# Patient Record
Sex: Male | Born: 1970 | Smoking: Current every day smoker
Health system: Southern US, Community
[De-identification: ages and names within clinical notes are randomized; demographics above are authoritative.]

## PROBLEM LIST (undated history)

## (undated) DIAGNOSIS — R079 Chest pain, unspecified: Secondary | ICD-10-CM

## (undated) DIAGNOSIS — I2583 Coronary atherosclerosis due to lipid rich plaque: Secondary | ICD-10-CM

## (undated) DIAGNOSIS — I10 Essential (primary) hypertension: Secondary | ICD-10-CM

## (undated) HISTORY — DX: Coronary atherosclerosis due to lipid rich plaque: I25.83

## (undated) HISTORY — PX: TONSILLECTOMY: SUR1361

## (undated) HISTORY — DX: Chest pain, unspecified: R07.9

## (undated) HISTORY — DX: Essential (primary) hypertension: I10

---

## 2018-07-13 ENCOUNTER — Emergency Department (HOSPITAL_COMMUNITY): Payer: 59

## 2018-07-13 ENCOUNTER — Emergency Department (HOSPITAL_COMMUNITY)
Admission: EM | Admit: 2018-07-13 | Discharge: 2018-07-13 | Discharge status: Against Medical Advice | Payer: 59 | Attending: Emergency Medicine | Admitting: Emergency Medicine

## 2018-07-13 ENCOUNTER — Encounter (HOSPITAL_COMMUNITY): Payer: Self-pay

## 2018-07-13 ENCOUNTER — Other Ambulatory Visit: Payer: Self-pay

## 2018-07-13 DIAGNOSIS — R51 Headache: Secondary | ICD-10-CM | POA: Diagnosis not present

## 2018-07-13 DIAGNOSIS — F17210 Nicotine dependence, cigarettes, uncomplicated: Secondary | ICD-10-CM | POA: Insufficient documentation

## 2018-07-13 DIAGNOSIS — R519 Headache, unspecified: Secondary | ICD-10-CM

## 2018-07-13 DIAGNOSIS — R5383 Other fatigue: Secondary | ICD-10-CM | POA: Diagnosis not present

## 2018-07-13 DIAGNOSIS — R531 Weakness: Secondary | ICD-10-CM | POA: Diagnosis not present

## 2018-07-13 DIAGNOSIS — Z5320 Procedure and treatment not carried out because of patient's decision for unspecified reasons: Secondary | ICD-10-CM | POA: Insufficient documentation

## 2018-07-13 LAB — COMPREHENSIVE METABOLIC PANEL
ALT: 18 U/L (ref 0–44)
AST: 19 U/L (ref 15–41)
Albumin: 4 g/dL (ref 3.5–5.0)
Alkaline Phosphatase: 109 U/L (ref 38–126)
Anion gap: 10 (ref 5–15)
BUN: 12 mg/dL (ref 6–20)
CO2: 24 mmol/L (ref 22–32)
Calcium: 9.6 mg/dL (ref 8.9–10.3)
Chloride: 107 mmol/L (ref 98–111)
Creatinine, Ser: 1.07 mg/dL (ref 0.61–1.24)
GFR calc Af Amer: >60 mL/min (ref >60)
GFR calc non Af Amer: >60 mL/min (ref >60)
Glucose, Bld: 107 mg/dL — ABNORMAL HIGH (ref 70–99)
Potassium: 4 mmol/L (ref 3.5–5.1)
Sodium: 141 mmol/L (ref 135–145)
Total Bilirubin: 0.5 mg/dL (ref 0.3–1.2)
Total Protein: 6.8 g/dL (ref 6.5–8.1)

## 2018-07-13 LAB — CBC
HCT: 43.3 % (ref 39.0–52.0)
Hemoglobin: 14.4 g/dL (ref 13.0–17.0)
MCH: 30.3 pg (ref 26.0–34.0)
MCHC: 33.3 g/dL (ref 30.0–36.0)
MCV: 91 fL (ref 80.0–100.0)
Platelets: 274 10*3/uL (ref 150–400)
RBC: 4.76 MIL/uL (ref 4.22–5.81)
RDW: 12.9 % (ref 11.5–15.5)
WBC: 12 10*3/uL — ABNORMAL HIGH (ref 4.0–10.5)
nRBC: 0 % (ref 0.0–0.2)

## 2018-07-13 LAB — DIFFERENTIAL
Abs Immature Granulocytes: 0.04 10*3/uL (ref 0.00–0.07)
Basophils Absolute: 0.1 10*3/uL (ref 0.0–0.1)
Basophils Relative: 1 %
Eosinophils Absolute: 0.3 10*3/uL (ref 0.0–0.5)
Eosinophils Relative: 2 %
Immature Granulocytes: 0 %
Lymphocytes Relative: 22 %
Lymphs Abs: 2.6 10*3/uL (ref 0.7–4.0)
Monocytes Absolute: 0.7 10*3/uL (ref 0.1–1.0)
Monocytes Relative: 6 %
Neutro Abs: 8.3 10*3/uL — ABNORMAL HIGH (ref 1.7–7.7)
Neutrophils Relative %: 69 %

## 2018-07-13 LAB — I-STAT CHEM 8, ED
BUN: 14 mg/dL (ref 6–20)
Calcium, Ion: 1.21 mmol/L (ref 1.15–1.40)
Chloride: 108 mmol/L (ref 98–111)
Creatinine, Ser: 1 mg/dL (ref 0.61–1.24)
Glucose, Bld: 100 mg/dL — ABNORMAL HIGH (ref 70–99)
HCT: 42 % (ref 39.0–52.0)
Hemoglobin: 14.3 g/dL (ref 13.0–17.0)
Potassium: 4 mmol/L (ref 3.5–5.1)
Sodium: 141 mmol/L (ref 135–145)
TCO2: 26 mmol/L (ref 22–32)

## 2018-07-13 LAB — APTT: aPTT: 25 seconds (ref 24–36)

## 2018-07-13 LAB — PROTIME-INR
INR: 1 (ref 0.8–1.2)
Prothrombin Time: 12.9 seconds (ref 11.4–15.2)

## 2018-07-13 LAB — CBG MONITORING, ED: Glucose-Capillary: 93 mg/dL (ref 70–99)

## 2018-07-13 MED ORDER — DEXAMETHASONE SODIUM PHOSPHATE 10 MG/ML IJ SOLN
10.0000 mg | Freq: Once | INTRAMUSCULAR | Status: AC
  Administered 2018-07-13: 22:00:00 10 mg via INTRAVENOUS
  Filled 2018-07-13: qty 1

## 2018-07-13 MED ORDER — SODIUM CHLORIDE 0.9% FLUSH
3.0000 mL | Freq: Once | INTRAVENOUS | Status: AC
  Administered 2018-07-13: 3 mL via INTRAVENOUS

## 2018-07-13 MED ORDER — DIPHENHYDRAMINE HCL 50 MG/ML IJ SOLN
25.0000 mg | Freq: Once | INTRAMUSCULAR | Status: AC
  Administered 2018-07-13: 22:00:00 25 mg via INTRAVENOUS
  Filled 2018-07-13: qty 1

## 2018-07-13 MED ORDER — SODIUM CHLORIDE 0.9 % IV BOLUS
1000.0000 mL | Freq: Once | INTRAVENOUS | Status: AC
  Administered 2018-07-13: 22:00:00 1000 mL via INTRAVENOUS

## 2018-07-13 MED ORDER — PROCHLORPERAZINE EDISYLATE 10 MG/2ML IJ SOLN
10.0000 mg | Freq: Once | INTRAMUSCULAR | Status: AC
  Administered 2018-07-13: 22:00:00 10 mg via INTRAVENOUS
  Filled 2018-07-13: qty 2

## 2018-07-13 NOTE — ED Notes (Signed)
Pt ambulated to the bathroom w/ no difficulties.   

## 2018-07-13 NOTE — ED Notes (Signed)
ED Provider at bedside. 

## 2018-07-13 NOTE — Discharge Instructions (Signed)
Please return the emergency department tomorrow so he can complete your work-up.  Should you experience any sudden worsening of symptoms, weakness, speech changes or vision disturbance, please call 911 to be transferred to the emergency department immediately.  Thank you for allowing [place] to participate in your care today.

## 2018-07-13 NOTE — ED Notes (Signed)
Pt notified RN that he had to go now b/c his wife was in the car and she has cancer and had to go home.  PA notified, bedside.  Plan: pt will leave AMA and comeback tomorrow for MRI tomorrow.  He understands he will need to check back in.  Pt calm and cooperate.

## 2018-07-13 NOTE — ED Triage Notes (Signed)
LSN Sunday, pt woke up with R sided facial droop, R sided numbness and weakness in his face and R arm. No drift. Also having headaches since Sunday and some confusion, pt states he spoke with PCP yesterday who told him to come and he didn't comprehend. Cough for a few days, had covid test yesterday that was negative.

## 2018-07-13 NOTE — ED Provider Notes (Signed)
[facility] EMERGENCY DEPARTMENT Provider Note   CSN: [id number] Arrival date & time: 07/13/18  1907    History   Chief Complaint Chief Complaint  Patient presents with  . Stroke Symptoms    HPI Joshua Pollard is a 48 y.o. male.     HPI   Patient is a 48 year old male with a past medical history of migraines and hypoglycemic episodes presenting for right-sided facial, arm, and leg weakness.  Last known well was 07/12/18 just before noon.  Patient reports that when he went to bed on 07-11-2018 he felt "not right" and thought he was "coming down with something".  He went to see his primary care provider who screened him for COVID and that test was negative.  His son reported that he noted that his father's right face was drooping and the patient felt that his right upper and right lower extremity were weak.  He reports that he developed a headache at some point yesterday but he is not sure when it began he reports that his current headache is worse than normal.  He describes it as sharp in the right retro-orbital region.  He denies visual disturbance.  He reports that his right lower extremity has decreased sensation but otherwise has no numbness.  Patient denies any lower extremity weakness currently.  Patient denies any personal history of CVA.  Denies history of cardiovascular or pulmonary disease.  No family history of early CVA.  He does have a grandfather who had a brain aneurysm.  Patient smokes approximately 2 packs of cigarettes per day.  History reviewed. No pertinent past medical history.  There are no active problems to display for this patient.   Past Surgical History:  Procedure Laterality Date  . TONSILLECTOMY          Home Medications    Prior to Admission medications   Not on File    Family History No family history on file.  Social History Social History   Tobacco Use  . Smoking status: Not on file  Substance Use Topics  . Alcohol use:  Not on file  . Drug use: Not on file     Allergies   Patient has no known allergies.   Review of Systems Review of Systems  Constitutional: Positive for fatigue. Negative for chills and fever.  HENT: Negative for congestion, rhinorrhea, sinus pain and sore throat.   Eyes: Negative for visual disturbance.  Respiratory: Negative for cough, chest tightness and shortness of breath.   Cardiovascular: Negative for chest pain, palpitations and leg swelling.  Gastrointestinal: Negative for abdominal pain, diarrhea, nausea and vomiting.  Genitourinary: Negative for dysuria and flank pain.  Musculoskeletal: Negative for back pain and myalgias.  Skin: Negative for rash.  Neurological: Positive for weakness and headaches. Negative for dizziness, seizures, syncope, speech difficulty and light-headedness.     Physical Exam Updated Vital Signs BP (!) 141/93   Pulse 77   Temp 98 F (36.7 C) (Oral)   Resp (!) 21   Ht 6\' 2"  (1.88 m)   Wt 85.3 kg   SpO2 96%   BMI 24.14 kg/m   Physical Exam Vitals signs and nursing note reviewed.  Constitutional:      General: He is not in acute distress.    Appearance: He is well-developed. He is not ill-appearing or diaphoretic.  HENT:     Head: Normocephalic and atraumatic.     Mouth/Throat:     Mouth: Mucous membranes are moist.  Eyes:  Conjunctiva/sclera: Conjunctivae normal.     Pupils: Pupils are equal, round, and reactive to light.  Neck:     Musculoskeletal: Normal range of motion and neck supple.  Cardiovascular:     Rate and Rhythm: Normal rate and regular rhythm.     Heart sounds: S1 normal and S2 normal. No murmur.  Pulmonary:     Effort: Pulmonary effort is normal.     Breath sounds: Normal breath sounds. No wheezing or rales.  Abdominal:     General: There is no distension.     Palpations: Abdomen is soft.     Tenderness: There is no abdominal tenderness. There is no guarding.  Musculoskeletal: Normal range of motion.         General: No deformity.  Lymphadenopathy:     Cervical: No cervical adenopathy.  Skin:    General: Skin is warm and dry.     Findings: No erythema or rash.  Neurological:     Mental Status: He is alert.     Comments: Mental Status:  Alert, oriented, thought content appropriate, able to give a coherent history. Speech fluent without evidence of aphasia. Able to follow 2 step commands without difficulty.  Cranial Nerves:  II:  Peripheral visual fields grossly normal, pupils equal, round, reactive to light III,IV, VI: ptosis not present, extra-ocular motions intact bilaterally  V,VII: Right lip is downturning. Patient preserves muscles of forehead. Facial light touch sensation equal VIII: hearing grossly normal to voice  X: uvula elevates symmetrically  XI: Decreased strength with shrug of right shoulder. Normal strength with shrug of left shoulder. XII: midline tongue extension without fassiculations Motor:  Normal tone. 5/5 in right lower extremities bilaterally including strong dorsiflexion/plantar flexion. Strength 4+/5 in right upper extremity with grip strength, flexion, extension, abduction and adduction.  Strength 5 out of 5 in left upper extremity throughout. Sensory: Pinprick and light touch normal in all extremities.  Deep Tendon Reflexes: 2+ and symmetric in the biceps and patella.  Cerebellar: normal finger-to-nose with bilateral upper extremities Gait: normal gait and balance Stance: No pronator drift and good coordination, strength, and position sense with tapping of bilateral arms (performed in sitting position). CV: distal pulses palpable throughout    Psychiatric:        Behavior: Behavior normal.        Thought Content: Thought content normal.        Judgment: Judgment normal.      ED Treatments / Results  Labs (all labs ordered are listed, but only abnormal results are displayed) Labs Reviewed  CBC - Abnormal; Notable for the following components:      Result  Value   WBC 12.0 (*)    All other components within normal limits  DIFFERENTIAL - Abnormal; Notable for the following components:   Neutro Abs 8.3 (*)    All other components within normal limits  COMPREHENSIVE METABOLIC PANEL - Abnormal; Notable for the following components:   Glucose, Bld 107 (*)    All other components within normal limits  I-STAT CHEM 8, ED - Abnormal; Notable for the following components:   Glucose, Bld 100 (*)    All other components within normal limits  PROTIME-INR  APTT  CBG MONITORING, ED    EKG EKG Interpretation  Date/Time:  Tuesday Jul 13 2018 19:28:56 EDT Ventricular Rate:  86 PR Interval:  138 QRS Duration: 80 QT Interval:  362 QTC Calculation: 433 R Axis:   25 Text Interpretation:  Normal sinus rhythm Normal ECG  no prior ECG for comparison.  No STEMI Confirmed by Theda Belfast ([id number]) on 07/13/2018 8:43:00 PM   Radiology Ct Head Wo Contrast  Result Date: 07/13/2018 CLINICAL DATA:  Right side facial numbness and weakness. Headaches. Symptoms since 07/11/2018. EXAM: CT HEAD WITHOUT CONTRAST TECHNIQUE: Contiguous axial images were obtained from the base of the skull through the vertex without intravenous contrast. COMPARISON:  None. FINDINGS: Brain: No evidence of acute infarction, hemorrhage, hydrocephalus, extra-axial collection or mass lesion/mass effect. Vascular: No hyperdense vessel or unexpected calcification. Skull: Normal. Negative for fracture or focal lesion. Sinuses/Orbits: Negative. Other: None. IMPRESSION: Negative exam. Electronically Signed   By: Drusilla Kanner M.D.   On: 07/13/2018 20:10    Procedures Procedures (including critical care time)  Medications Ordered in ED Medications  sodium chloride flush (NS) 0.9 % injection 3 mL (3 mLs Intravenous Given 07/13/18 2131)  sodium chloride 0.9 % bolus 1,000 mL (1,000 mLs Intravenous New Bag/Given 07/13/18 2135)  prochlorperazine (COMPAZINE) injection 10 mg (10 mg Intravenous Given  07/13/18 2138)  diphenhydrAMINE (BENADRYL) injection 25 mg (25 mg Intravenous Given 07/13/18 2138)  dexamethasone (DECADRON) injection 10 mg (10 mg Intravenous Given 07/13/18 2138)     Initial Impression / Assessment and Plan / ED Course  I have reviewed the triage vital signs and the nursing notes.  Pertinent labs & imaging results that were available during my care of the patient were reviewed by me and considered in my medical decision making (see chart for details).  Clinical Course as of Jul 13 45  Tue Jul 13, 2018  2046 WBC(!): 12.0 [AM]  2124 Spoke with Dr. Laurence Slate who recommends migraine cocktail and MRI brain without contrast. If MRI negative and HA still not improving, would recommend valproic acid load. Appreciate his involvement.    [AM]  2125 Patient reports he cannot stay any longer.  He reports that his wife has cancer and she is waiting in the car and severe pain in his vomiting.  Patient reports that he understands that having a CVA could potentially be disabling if not found.  Patient reports symptoms are improving.  Objectively, facial drooping is improving, and his right upper extremity appears more functional.  He is aware of the risks of leaving.  He reports that he will return tomorrow to obtain his MRI. I instructed patient not to drive until symptoms are worked up. He reports his wife will drive home tonight.    [AM]    Clinical Course User Index [AM] Elisha Ponder, PA-C       This is a 49 year old male.  EKG with no significant cerebrovascular risk factors presenting for right-sided facial and arm weakness.  Patient is well over 24 hours since last known well.  He is also concomitantly having a migraine, and differential includes CVA, complicated migraine, intracranial tumor.   Work-up showing mild leukocytosis of 12.0.  No significant laboratory abnormalities.  EKG in normal sinus rhythm with no evidence of acute ischemia, infarction, or arrhythmia.  CT  noncontrast of head demonstrates no acute abnormalities.  Per discussion with Dr. Laurence Slate of neurology, will obtain MRI brain without contrast and if normal, will continue to treat for migraine.  Patient given migraine cocktail.  Prior to MRI, patient reported that he could not stay because his wife who is a cancer patient is in severe pain and vomiting and needed to go home to obtain her medication.  He is in full understanding of the risks of leaving without completing a CVA  work-up.  He reports that he will return tomorrow and will have someone drive him.  Patient had the capacity to make this decision.  He reports that his headache was improving and objectively his weakness was also improving.   This is a supervised visit with Dr. Thayer Ohmhris Tegeler. Evaluation, management, and discharge planning discussed with this attending physician.  Final Clinical Impressions(s) / ED Diagnoses   Final diagnoses:  Right sided weakness  Bad headache    ED Discharge Orders    None       Delia ChimesMurray, Alyssa B, PA-C 07/14/18 0050    Tegeler, Canary Brimhristopher J, MD 07/16/18 1241

## 2018-07-19 ENCOUNTER — Emergency Department (HOSPITAL_COMMUNITY)
Admission: EM | Admit: 2018-07-19 | Discharge: 2018-07-19 | Disposition: A | Discharge status: Home / Self Care | Payer: 59 | Attending: Emergency Medicine | Admitting: Emergency Medicine

## 2018-07-19 ENCOUNTER — Emergency Department (HOSPITAL_COMMUNITY): Payer: 59

## 2018-07-19 ENCOUNTER — Encounter (HOSPITAL_COMMUNITY): Payer: Self-pay | Admitting: *Deleted

## 2018-07-19 ENCOUNTER — Other Ambulatory Visit: Payer: Self-pay

## 2018-07-19 DIAGNOSIS — R0602 Shortness of breath: Secondary | ICD-10-CM | POA: Diagnosis present

## 2018-07-19 DIAGNOSIS — G43909 Migraine, unspecified, not intractable, without status migrainosus: Secondary | ICD-10-CM | POA: Insufficient documentation

## 2018-07-19 DIAGNOSIS — Z20828 Contact with and (suspected) exposure to other viral communicable diseases: Secondary | ICD-10-CM | POA: Insufficient documentation

## 2018-07-19 LAB — CBC WITH DIFFERENTIAL/PLATELET
Abs Immature Granulocytes: 0.03 10*3/uL (ref 0.00–0.07)
Basophils Absolute: 0.1 10*3/uL (ref 0.0–0.1)
Basophils Relative: 1 %
Eosinophils Absolute: 0.3 10*3/uL (ref 0.0–0.5)
Eosinophils Relative: 4 %
HCT: 40.6 % (ref 39.0–52.0)
Hemoglobin: 13.5 g/dL (ref 13.0–17.0)
Immature Granulocytes: 0 %
Lymphocytes Relative: 31 %
Lymphs Abs: 2.7 10*3/uL (ref 0.7–4.0)
MCH: 30.3 pg (ref 26.0–34.0)
MCHC: 33.3 g/dL (ref 30.0–36.0)
MCV: 91.2 fL (ref 80.0–100.0)
Monocytes Absolute: 0.7 10*3/uL (ref 0.1–1.0)
Monocytes Relative: 8 %
Neutro Abs: 4.8 10*3/uL (ref 1.7–7.7)
Neutrophils Relative %: 56 %
Platelets: 250 10*3/uL (ref 150–400)
RBC: 4.45 MIL/uL (ref 4.22–5.81)
RDW: 13.1 % (ref 11.5–15.5)
WBC: 8.5 10*3/uL (ref 4.0–10.5)
nRBC: 0 % (ref 0.0–0.2)

## 2018-07-19 LAB — COMPREHENSIVE METABOLIC PANEL
ALT: 22 U/L (ref 0–44)
AST: 19 U/L (ref 15–41)
Albumin: 3.7 g/dL (ref 3.5–5.0)
Alkaline Phosphatase: 94 U/L (ref 38–126)
Anion gap: 11 (ref 5–15)
BUN: 13 mg/dL (ref 6–20)
CO2: 24 mmol/L (ref 22–32)
Calcium: 9.2 mg/dL (ref 8.9–10.3)
Chloride: 107 mmol/L (ref 98–111)
Creatinine, Ser: 0.82 mg/dL (ref 0.61–1.24)
GFR calc Af Amer: >60 mL/min (ref >60)
GFR calc non Af Amer: >60 mL/min (ref >60)
Glucose, Bld: 125 mg/dL — ABNORMAL HIGH (ref 70–99)
Potassium: 4.2 mmol/L (ref 3.5–5.1)
Sodium: 142 mmol/L (ref 135–145)
Total Bilirubin: 0.4 mg/dL (ref 0.3–1.2)
Total Protein: 6.5 g/dL (ref 6.5–8.1)

## 2018-07-19 LAB — D-DIMER, QUANTITATIVE: D-Dimer, Quant: <0.27 ug/mL-FEU (ref 0.00–0.50)

## 2018-07-19 LAB — SARS CORONAVIRUS 2 BY RT PCR (HOSPITAL ORDER, PERFORMED IN ~~LOC~~ HOSPITAL LAB): SARS Coronavirus 2: NEGATIVE

## 2018-07-19 LAB — BRAIN NATRIURETIC PEPTIDE: B Natriuretic Peptide: 15.2 pg/mL (ref 0.0–100.0)

## 2018-07-19 LAB — TROPONIN I: Troponin I: <0.03 ng/mL (ref <0.03)

## 2018-07-19 NOTE — ED Triage Notes (Addendum)
Pt in c/o continued headache, weakness, altered mental status, and heaviness in his chest with shortness of breath, denies cough, unsure of fever at home. Pt was seen on the 12th and evaluated for CVA, pt reports symptoms are just not improving and he is very fatigued and his chest feels tight, also noted to have some swelling to this right side of his face. Pt answers most questions appropriately, is unsure of timeline of illness and says he has had trouble remembering things at home recently.

## 2018-07-19 NOTE — ED Notes (Signed)
Patient verbalizes understanding of discharge instructions. Opportunity for questioning and answers were provided. Armband removed by staff, pt discharged from ED ambulatory.   

## 2018-07-19 NOTE — ED Notes (Signed)
Pt at MRI at this time.

## 2018-07-19 NOTE — ED Triage Notes (Signed)
Pt stated that he had a zoom visit with his PCP & his DR wanted him to be evaluated. Pt said he thinks he had a stroke during his sleep last Monday d/t feeling "weird" with n/v & the Rt side of his body felt numb & it hurt, plus the Lt side of his head hurt at the time it was occurring. As of now the pt has a rt-sided facial droop noted in his mouth as well as mentioning that he has a hard time focusing because his attention & focus feels off & fuzzy.

## 2018-07-19 NOTE — ED Provider Notes (Signed)
[facility] Provider Note   CSN: [id number] Arrival date & time: 07/19/18  1413    History   Chief Complaint Chief Complaint  Patient presents with  . Shortness of Breath  . Headache    HPI Joshua EatonLarry Pollard is a 48 y.o. male.  HPI 48 year old male with history of neuropathy presents with complaints of right-sided weakness, right-sided facial droop, shortness of breath, and headache.  Patient was evaluated on 5/12 with over 24 hours of right-sided facial and arm weakness.  He had a negative head CT.  Neurology was consulted and recommended an MRI brain without contrast however patient was unable to stay for further evaluation.  He presents today with ongoing symptoms of not improved.  He endorses some right facial swelling, ongoing headache, intermittent shortness of breath with exertion, and chest pain that is worse with cough.  Chest pain is described as pressure and nonradiating.  No associated nausea, vomiting, or diaphoresis.  He also reports that his wife has said that his speech is different.  History reviewed. No pertinent past medical history.  There are no active problems to display for this patient.   Past Surgical History:  Procedure Laterality Date  . TONSILLECTOMY          Home Medications    Prior to Admission medications   Not on File    Family History History reviewed. No pertinent family history.  Social History Social History   Tobacco Use  . Smoking status: Not on file  Substance Use Topics  . Alcohol use: Not on file  . Drug use: Not on file     Allergies   Patient has no known allergies.   Review of Systems Review of Systems  Constitutional: Negative for chills and fever.  HENT: Negative for ear pain and sore throat.   Eyes: Negative for pain and visual disturbance.  Respiratory: Positive for cough and shortness of breath.   Cardiovascular: Negative for chest pain and palpitations.   Gastrointestinal: Negative for abdominal pain and vomiting.  Genitourinary: Negative for dysuria and hematuria.  Musculoskeletal: Negative for arthralgias and back pain.  Skin: Negative for color change and rash.  Neurological: Positive for weakness and headaches. Negative for seizures and syncope.  All other systems reviewed and are negative.    Physical Exam Updated Vital Signs BP (!) 149/89   Pulse 82   Temp 98.7 F (37.1 C) (Oral)   Resp 16   Ht 6\' 4"  (1.93 m)   Wt 85.3 kg   SpO2 95%   BMI 22.88 kg/m   Physical Exam Vitals signs and nursing note reviewed.  Constitutional:      Appearance: He is well-developed.  HENT:     Head: Normocephalic and atraumatic.     Comments: No facial swelling appreciated Tongue is soft No neck tenderness or swelling Eyes:     Conjunctiva/sclera: Conjunctivae normal.  Neck:     Musculoskeletal: Neck supple.  Cardiovascular:     Rate and Rhythm: Normal rate and regular rhythm.     Heart sounds: No murmur.  Pulmonary:     Effort: Pulmonary effort is normal. No respiratory distress.     Breath sounds: Normal breath sounds.  Abdominal:     Palpations: Abdomen is soft.     Tenderness: There is no abdominal tenderness.  Musculoskeletal:     Right lower leg: He exhibits no tenderness. No edema.     Left lower leg: He exhibits no tenderness. No edema.  Skin:    General: Skin is warm and dry.  Neurological:     Mental Status: He is alert.     Comments:  Mental Status:  Orientation: Alert and oriented to person, place, and time.  Memory: Cooperative, follows commands well. Recent and remote memory normal.  Attention, Concentration: Attention span and concentration are normal.  Language: Speech is clear and language is normal.  Fund of Knowledge: Aware of current events, vocabulary appropriate for patient age.  Cranial Nerves   II Visual Fields:  Intact to confrontation III, IV, VI:  Pupils equal and reactive to light and near. Full  eye movement without nystagmus  V Facial Sensation:  Decreased sensation on the right side of the face VII:  No facial weakness or asymmetry  VIII Auditory Acuity:  Grossly normal  IX/X:  The uvula to the left; the palate elevates symmetrically  XI:  Normal sternocleidomastoid and trapezius strength  XII:  The tongue is midline. No atrophy or fasciculations.    Motor System:  Muscle Strength: 3/5 weakness in the right upper extremity, 4/5 in the right lower extremity.  5/5 in both left upper and lower extremities Muscle Tone: Tone and muscle bulk are normal in the upper and lower extremities.   Coordination:  Intact finger-to-nose, heel-to-shin, and rapid alternating movements. No tremor.  Sensation:  Intact to light touch, vibration, and pinprick.  Gait:         ED Treatments / Results  Labs (all labs ordered are listed, but only abnormal results are displayed) Labs Reviewed  COMPREHENSIVE METABOLIC PANEL - Abnormal; Notable for the following components:      Result Value   Glucose, Bld 125 (*)    All other components within normal limits  SARS CORONAVIRUS 2 (HOSPITAL ORDER, PERFORMED IN [facility])  CBC WITH DIFFERENTIAL/PLATELET  TROPONIN I  BRAIN NATRIURETIC PEPTIDE  D-DIMER, QUANTITATIVE (NOT AT [facility])    EKG EKG Interpretation  Date/Time:  Monday Jul 19 2018 15:17:38 EDT Ventricular Rate:  93 PR Interval:    QRS Duration: 86 QT Interval:  353 QTC Calculation: 439 R Axis:   23 Text Interpretation:  Sinus rhythm Abnormal R-wave progression, early transition No acute changes No significant change since last tracing Confirmed by Derwood Kaplan [phone]) on 07/19/2018 3:49:09 PM   Radiology Ct Head Wo Contrast  Result Date: 07/19/2018 CLINICAL DATA:  48 year old male with history of persistent headache, weakness and altered mental status. EXAM: CT HEAD WITHOUT CONTRAST TECHNIQUE: Contiguous axial images were obtained from the base of the skull through the  vertex without intravenous contrast. COMPARISON:  Head CT 07/13/2018. FINDINGS: Brain: No evidence of acute infarction, hemorrhage, hydrocephalus, extra-axial collection or mass lesion/mass effect. Vascular: No hyperdense vessel or unexpected calcification. Skull: Normal. Negative for fracture or focal lesion. Sinuses/Orbits: No acute finding. Other: None. IMPRESSION: 1. No acute intracranial abnormalities. The appearance of the brain is normal. Electronically Signed   By: Trudie Reed M.D.   On: 07/19/2018 16:55   Dg Chest Portable 1 View  Result Date: 07/19/2018 CLINICAL DATA:  Cough EXAM: PORTABLE CHEST 1 VIEW COMPARISON:  None. FINDINGS: Lungs are clear.  No pleural effusion or pneumothorax. The heart is normal in size. IMPRESSION: No evidence of acute cardiopulmonary disease. Electronically Signed   By: Charline Bills M.D.   On: 07/19/2018 16:16    Procedures Procedures (including critical care time)  Medications Ordered in ED Medications - No data to display   Initial Impression / Assessment and  Plan / ED Course  I have reviewed the triage vital signs and the nursing notes.  Pertinent labs & imaging results that were available during my care of the patient were reviewed by me and considered in my medical decision making (see chart for details).  48 year old male with history of neuropathy presents with complaints of right-sided weakness, right-sided facial droop, shortness of breath, and headache.  Patient has weakness on the right upper and lower extremity on my exam.  While complex migraine means of the differential, consider this less likely given the symptoms are lasting more than a week.  EKG normal sinus rhythm with no ischemic changes.  CT Head shows no acute intracranial abnormality.  Neurology consulted and recommended MRI.  MRI shows no acute intracranial abnormality identified.  There are mild to moderate nonspecific white matter hyperintensities that are unexpected  for age.  I discussed the lab and imaging findings with the patient.  Patient does have a history of migraine headache and has had recent headaches that are worse than prior.  Patient's symptoms could be complex migraine.  I recommended that he have outpatient follow-up with neurology.  Patient voiced understanding.  Patient discharged home in stable condition with strict return precautions.  Final Clinical Impressions(s) / ED Diagnoses   Final diagnoses:  None    ED Discharge Orders    None       Vallery Ridge, MD 07/20/18 0107    Derwood Kaplan, MD 07/20/18 2059

## 2020-03-30 IMAGING — CT CT HEAD WITHOUT CONTRAST
3 series · 16 of 47 positions shown, 19 images · non-contrast
Comparison: None.

CLINICAL DATA: Right side facial numbness and weakness. Headaches.
Symptoms since 07/11/2018.

EXAM:
CT HEAD WITHOUT CONTRAST
TECHNIQUE: Contiguous axial images were obtained from the base of the skull
through the vertex without intravenous contrast.

[Series 3: head 5.0 h30s · axial · 0.41mm/px · z∈[-50,+90]mm · 10 of 34 slices shown, 13 images]
[im 3/34  brain]
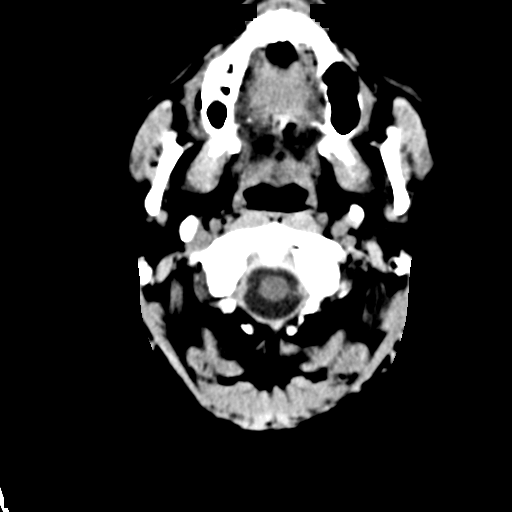
[im 3/34  bone]
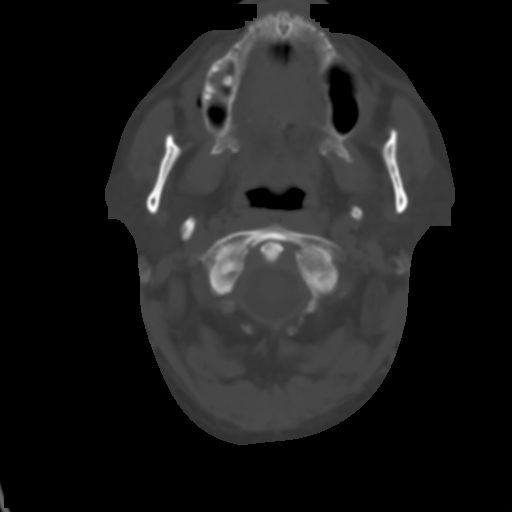
[im 6/34  brain]
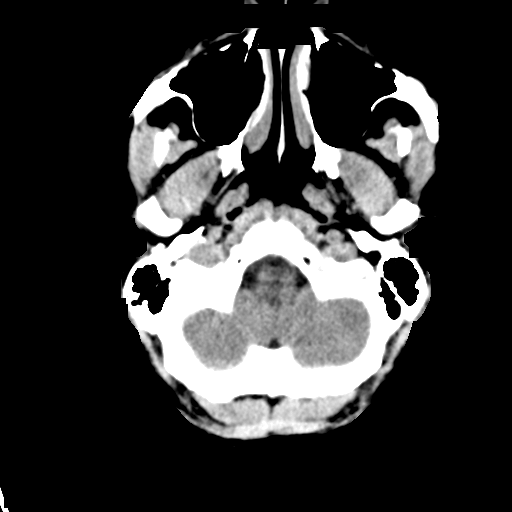
[im 10/34  brain]
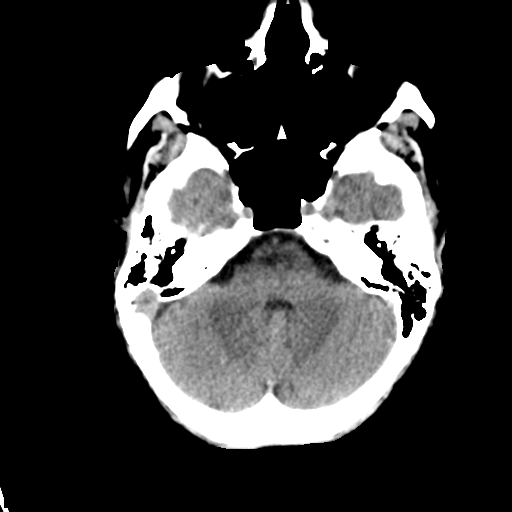
[im 12/34  brain]
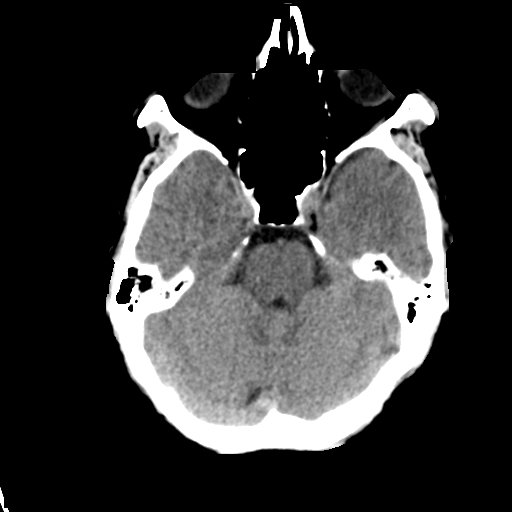
[im 15/34  brain]
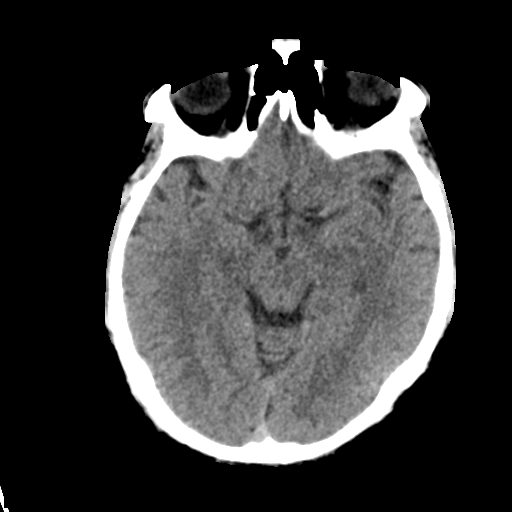
[im 15/34  bone]
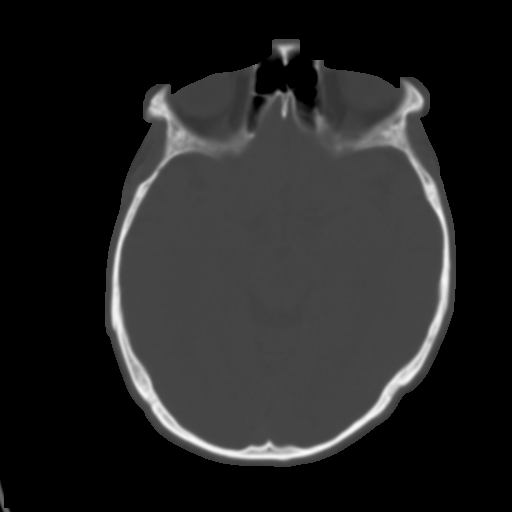
[im 19/34  brain]
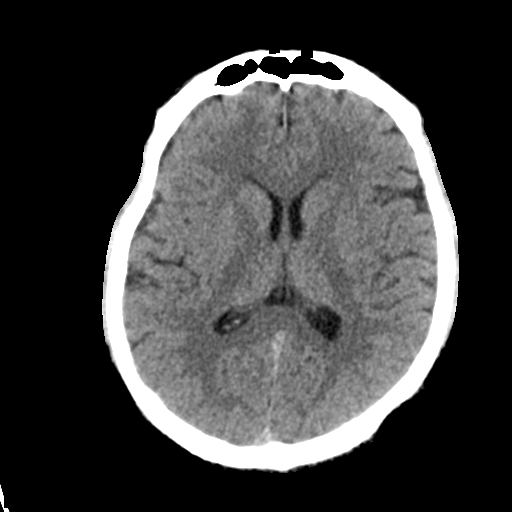
[im 22/34  brain]
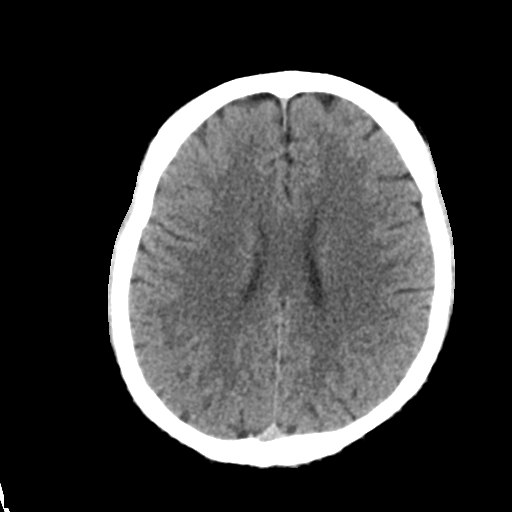
[im 26/34  brain]
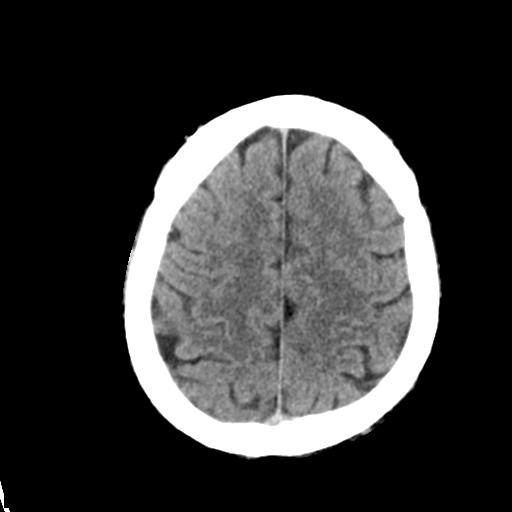
[im 28/34  brain]
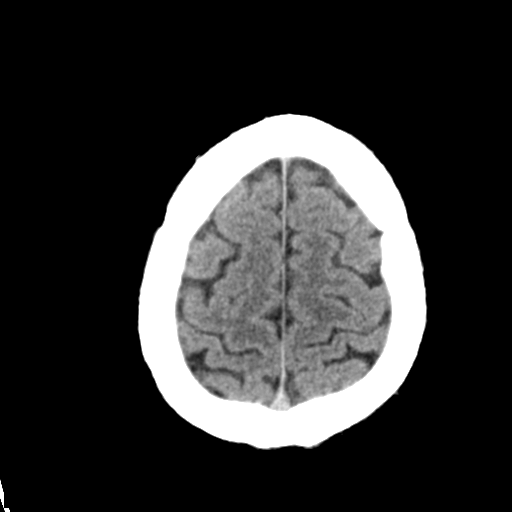
[im 28/34  bone]
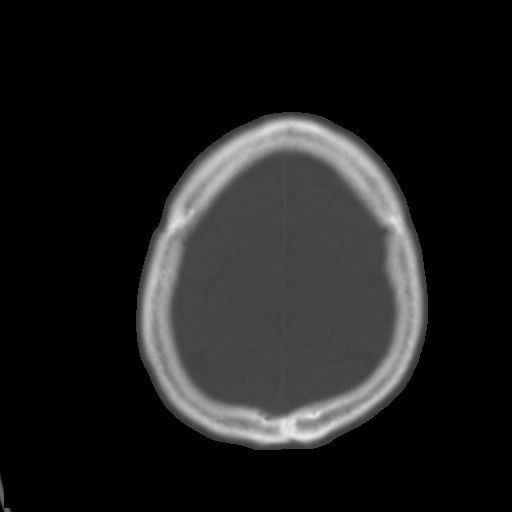
[im 31/34  brain]
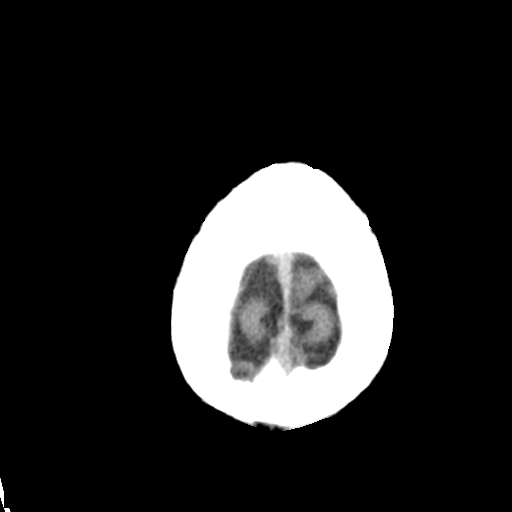

[Series 5: head 3.0 mpr cor · coronal · 0.33mm/px · 3 of 68 slices shown]
[im 23/68  brain]
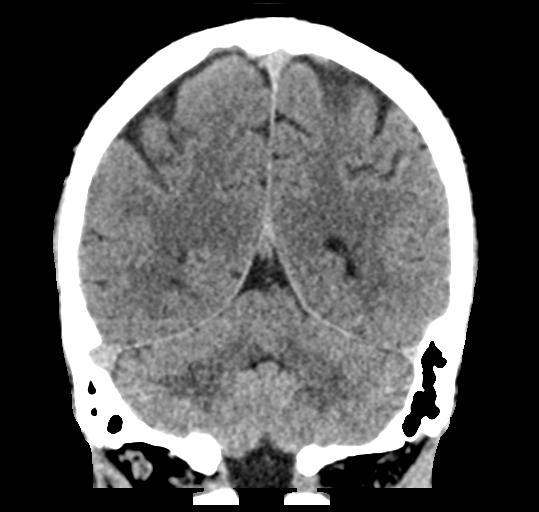
[im 30/68  brain]
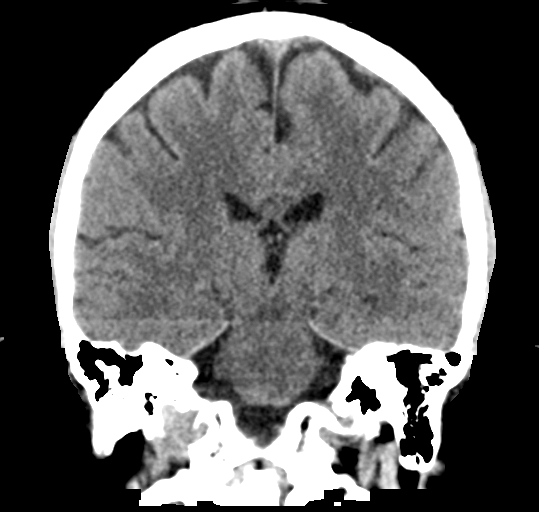
[im 38/68  brain]
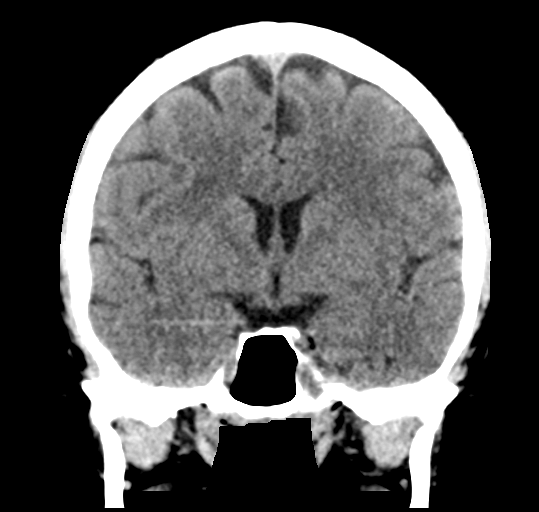

[Series 6: head 3.0 mpr sag · sagittal · 0.33mm/px · 3 of 59 slices shown]
[im 20/59  brain]
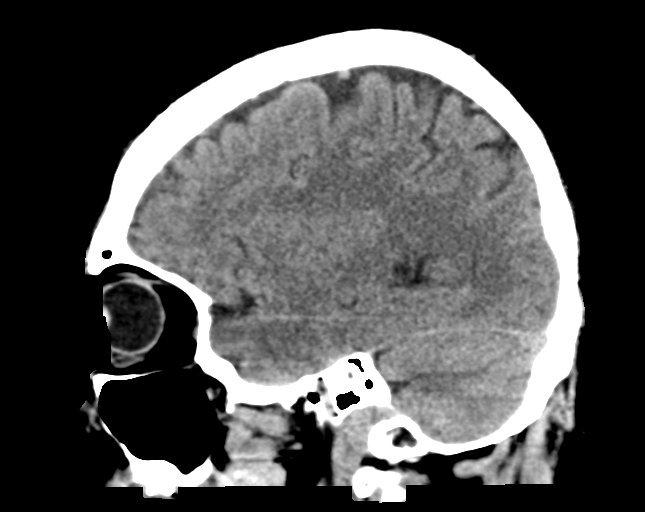
[im 30/59  brain]
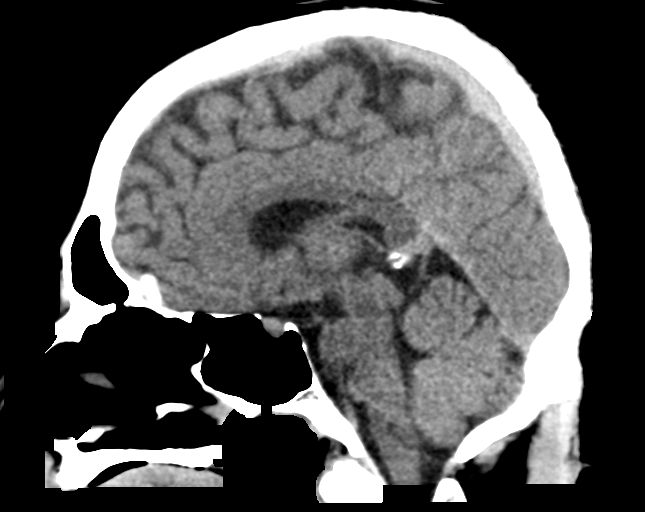
[im 39/59  brain]
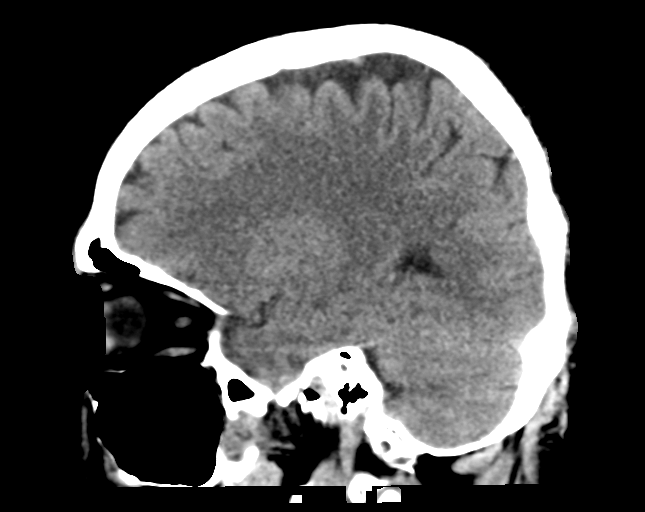

[16 of 47 positions shown; findings below may reference images not displayed]

FINDINGS: Brain: No evidence of acute infarction, hemorrhage, hydrocephalus,
extra-axial collection or mass lesion/mass effect.

Vascular: No hyperdense vessel or unexpected calcification.

Skull: Normal. Negative for fracture or focal lesion.

Sinuses/Orbits: Negative.

Other: None.
IMPRESSION: Negative exam.

## 2020-04-05 IMAGING — DX PORTABLE CHEST - 1 VIEW
1 series · 1 of 1 positions shown · non-contrast
Comparison: None.

CLINICAL DATA: Cough

EXAM:
PORTABLE CHEST 1 VIEW

[chest]
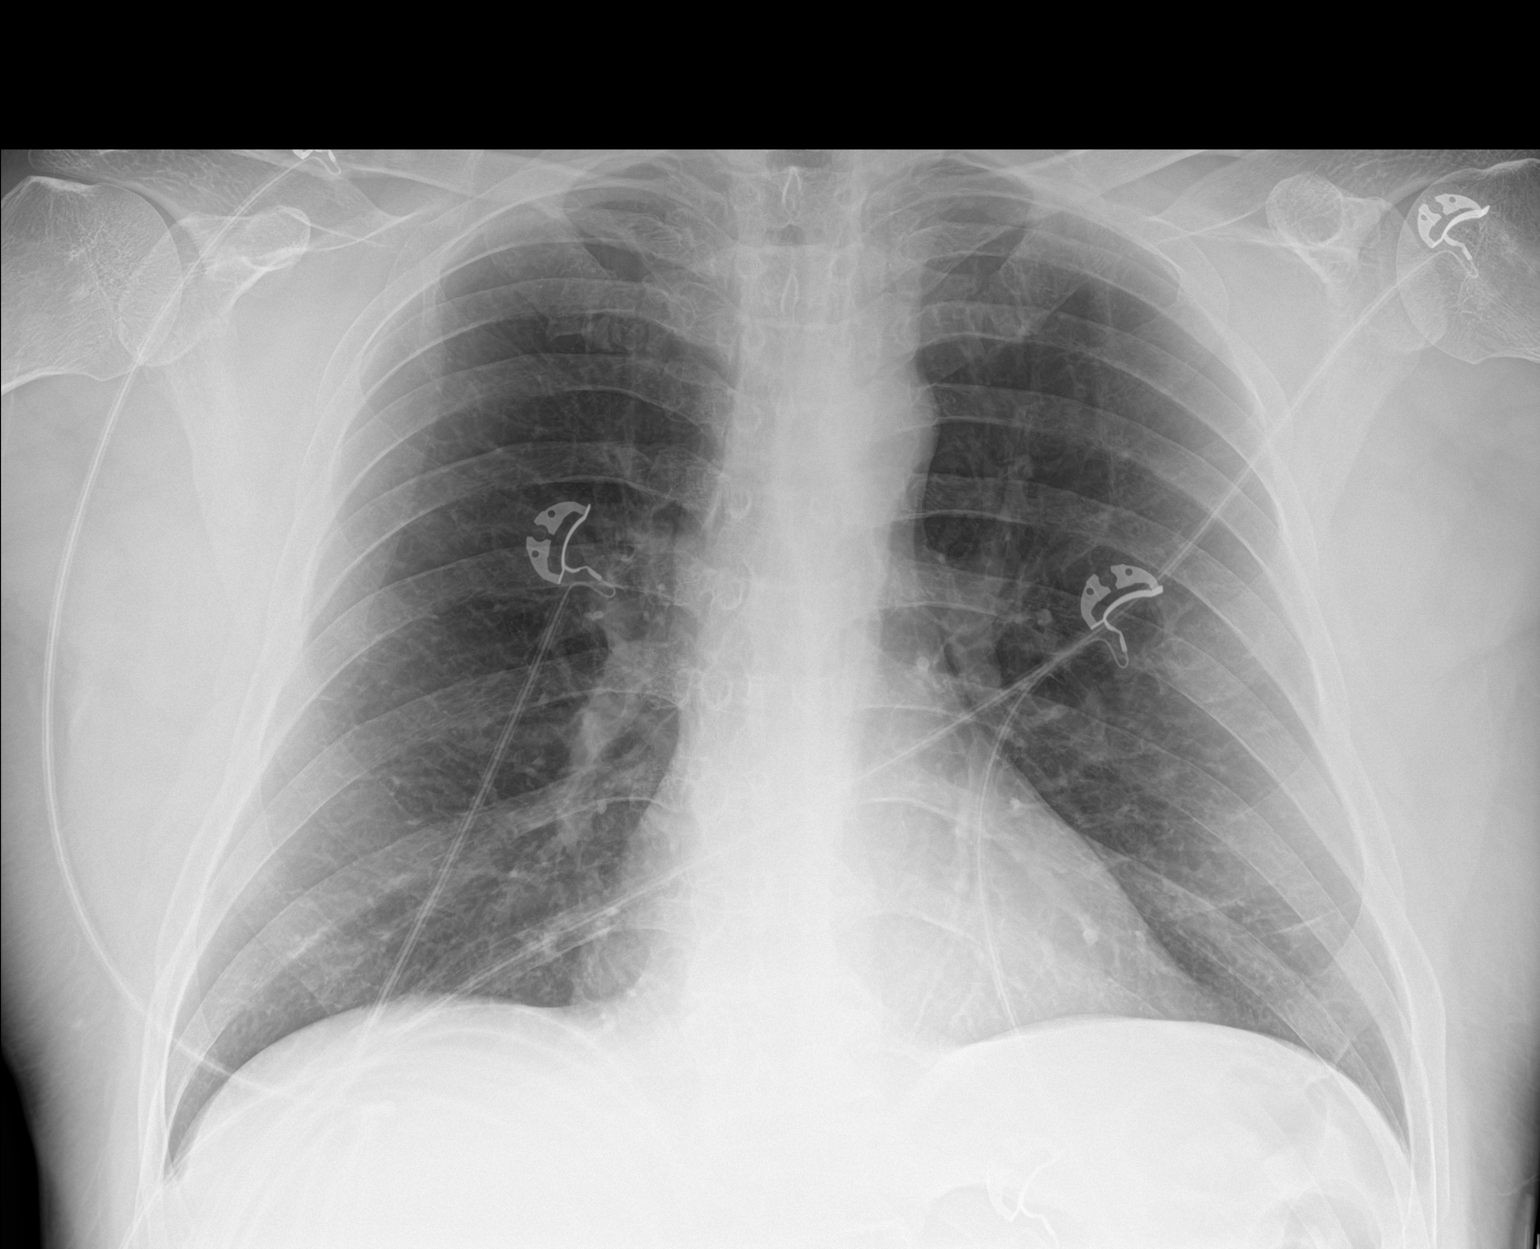

[1 of 1 positions shown; findings below may reference images not displayed]

FINDINGS: Lungs are clear.  No pleural effusion or pneumothorax.

The heart is normal in size.
IMPRESSION: No evidence of acute cardiopulmonary disease.

## 2023-10-07 ENCOUNTER — Other Ambulatory Visit: Payer: Self-pay

## 2023-10-07 DIAGNOSIS — I10 Essential (primary) hypertension: Secondary | ICD-10-CM | POA: Insufficient documentation

## 2023-10-07 DIAGNOSIS — I2583 Coronary atherosclerosis due to lipid rich plaque: Secondary | ICD-10-CM | POA: Insufficient documentation

## 2023-10-07 DIAGNOSIS — R079 Chest pain, unspecified: Secondary | ICD-10-CM | POA: Insufficient documentation

## 2023-10-08 ENCOUNTER — Encounter: Payer: Self-pay | Admitting: Cardiology

## 2023-10-08 ENCOUNTER — Ambulatory Visit: Payer: Self-pay | Attending: Cardiology | Admitting: Cardiology

## 2023-10-08 VITALS — BP 134/88 | HR 65 | Ht 73.0 in | Wt 207.0 lb

## 2023-10-08 DIAGNOSIS — I700 Atherosclerosis of aorta: Secondary | ICD-10-CM | POA: Insufficient documentation

## 2023-10-08 DIAGNOSIS — I10 Essential (primary) hypertension: Secondary | ICD-10-CM

## 2023-10-08 DIAGNOSIS — F17210 Nicotine dependence, cigarettes, uncomplicated: Secondary | ICD-10-CM | POA: Diagnosis not present

## 2023-10-08 DIAGNOSIS — Z79899 Other long term (current) drug therapy: Secondary | ICD-10-CM | POA: Insufficient documentation

## 2023-10-08 DIAGNOSIS — I2583 Coronary atherosclerosis due to lipid rich plaque: Secondary | ICD-10-CM | POA: Diagnosis not present

## 2023-10-08 DIAGNOSIS — R072 Precordial pain: Secondary | ICD-10-CM

## 2023-10-08 MED ORDER — METOPROLOL TARTRATE 25 MG PO TABS: ORAL_TABLET | ORAL | 0 refills | Status: AC

## 2023-10-08 MED ORDER — NITROGLYCERIN 0.4 MG SL SUBL: 0.4000 mg | SUBLINGUAL_TABLET | SUBLINGUAL | 6 refills | Status: AC | PRN

## 2023-10-08 MED ORDER — VARENICLINE TARTRATE 0.5 MG PO TABS: ORAL_TABLET | ORAL | 0 refills | Status: AC

## 2023-10-08 MED ORDER — VARENICLINE TARTRATE 1 MG PO TABS: 1.0000 mg | ORAL_TABLET | Freq: Two times a day (BID) | ORAL | 0 refills | Status: DC

## 2023-10-08 MED ORDER — VARENICLINE TARTRATE 0.5 MG PO TABS: ORAL_TABLET | ORAL | 0 refills | Status: DC

## 2023-10-08 NOTE — Progress Notes (Signed)
 Cardiology Office Note:    Date:  10/08/2023   ID:  Joshua Pollard, DOB June 22, 1970, MRN [id number]  PCP:  Joshua Kerney SQUIBB, MD  Cardiologist:  Joshua JONELLE Crape, MD   Referring MD: Joshua Kerney SQUIBB, MD    ASSESSMENT:    1. Essential (primary) hypertension   2. Coronary atherosclerosis due to lipid rich plaque   3. Aortic atherosclerosis (HCC)   4. Cigarette smoker   5. On statin therapy due to risk of future cardiovascular event    PLAN:    In order of problems listed above:  Coronary artery calcification: Aortic atherosclerosis: Chest pain: Atypical in nature but in view of findings we will do exercise stress echo.  He is agreeable.  If this is negative he was advised to embark on an graded exercise program. Essential hypertension: Blood pressure stable and diet was emphasized. Mixed dyslipidemia: He has been on statin since the past 2 days.  He will be back in 6 weeks for liver lipid check. Cardiac murmur: Echocardiogram will be done to assess murmur heard on auscultation. Cigarette smoker: I spent 5 minutes with the patient discussing solely about smoking. Smoking cessation was counseled. I suggested to the patient also different medications and pharmacological interventions. Patient is keen to try stopping on its own at this time. He will get back to me if he needs any further assistance in this matter. Patient will be seen in follow-up appointment in 6 months or earlier if the patient has any concerns.   My nurse came back to me and told me that the patient wants to discuss more with me.  Now the patient gave different symptoms that appeared to be little more concerning.  He said he was walking at [facility] yesterday and when he increased his speed he had some chest tightness.  Again this context I have canceled the stress echo plan and will do a CT coronary angiography with FFR.  Sublingual nitroglycerin  prescription was sent, its protocol and 911 protocol explained and the patient vocalized  understanding questions were answered to the patient's satisfaction.  Patient was advised not to exert himself more than usual and go to the nearest ER for any concerning symptoms.  He and his wife vocalized understanding and questions were answered to their satisfaction.   Medication Adjustments/Labs and Tests Ordered: Current medicines are reviewed at length with the patient today.  Concerns regarding medicines are outlined above.  Orders Placed This Encounter  Procedures   EKG 12-Lead   No orders of the defined types were placed in this encounter.    History of Present Illness:    Joshua Pollard is a 53 y.o. male who is being seen today for the evaluation of coronary artery calcification seen on CT scan at the request of Joshua Kerney SQUIBB, MD. patient is a pleasant 53 year old male.  He has past medical history of essential hypertension: He is on statin therapy as recently has been found to have coronary artery calcification and aortic atherosclerosis.  No history of diabetes mellitus.  Unfortunately he continues to smoke.  He leads a sedentary lifestyle.  He does not exercise on a regular basis.  At the time of my evaluation, the patient is alert awake oriented and in no distress.  He has chest discomfort at times and this is not related to exertion.  Past Medical History:  Diagnosis Date   Chest pain    Coronary atherosclerosis due to lipid rich plaque    Essential (primary) hypertension  Past Surgical History:  Procedure Laterality Date   TONSILLECTOMY      Current Medications: Current Meds  Medication Sig   albuterol (VENTOLIN HFA) 108 (90 Base) MCG/ACT inhaler Inhale 1 puff into the lungs every 4 (four) hours as needed for wheezing or shortness of breath.   amLODipine (NORVASC) 5 MG tablet Take 5 mg by mouth daily.   aspirin EC 81 MG tablet Take 81 mg by mouth daily.   atorvastatin (LIPITOR) 40 MG tablet Take 40 mg by mouth daily.   busPIRone (BUSPAR) 15 MG tablet Take 15 mg  by mouth 2 (two) times daily.   gabapentin (NEURONTIN) 300 MG capsule Take 600 mg by mouth daily.   methocarbamol (ROBAXIN) 500 MG tablet Take 500 mg by mouth 3 (three) times daily.   topiramate (TOPAMAX) 25 MG tablet Take 25 mg by mouth daily.     Allergies:   Sudafed [pseudoephedrine]   Social History   Socioeconomic History   Marital status: Unknown    Spouse name: Not on file   Number of children: Not on file   Years of education: Not on file   Highest education level: Not on file  Occupational History   Not on file  Tobacco Use   Smoking status: Every Day    Types: Cigarettes   Smokeless tobacco: Never  Substance and Sexual Activity   Alcohol use: Not on file   Drug use: Not on file   Sexual activity: Not on file  Other Topics Concern   Not on file  Social History Narrative   Not on file   Social Drivers of Health   Financial Resource Strain: Not on file  Food Insecurity: Not on file  Transportation Needs: Not on file  Physical Activity: Not on file  Stress: Not on file  Social Connections: Not on file     Family History: The patient's family history includes Cancer in his mother.  ROS:   Please see the history of present illness.    All other systems reviewed and are negative.  EKGs/Labs/Other Studies Reviewed:    The following studies were reviewed today:  EKG Interpretation Date/Time:  Thursday October 08 2023 09:34:26 EDT Ventricular Rate:  65 PR Interval:  130 QRS Duration:  102 QT Interval:  392 QTC Calculation: 407 R Axis:   2  Text Interpretation: Normal sinus rhythm with sinus arrhythmia Left ventricular hypertrophy Cannot rule out Inferior infarct , age undetermined Abnormal ECG When compared with ECG of 19-Jul-2018 15:17, PREVIOUS ECG IS PRESENT Confirmed by Edwyna Backers [phone]) on 10/08/2023 9:45:04 AM     Recent Labs: No results found for requested labs within last 365 days.  Recent Lipid Panel No results found for: CHOL, TRIG,  HDL, CHOLHDL, VLDL, LDLCALC, LDLDIRECT  Physical Exam:    VS:  BP 134/88   Pulse 65   Ht 6' 1 (1.854 m)   Wt 207 lb (93.9 kg)   SpO2 97%   BMI 27.31 kg/m     Wt Readings from Last 3 Encounters:  10/08/23 207 lb (93.9 kg)  07/19/18 188 lb (85.3 kg)  07/13/18 188 lb (85.3 kg)     GEN: Patient is in no acute distress HEENT: Normal NECK: No JVD; No carotid bruits LYMPHATICS: No lymphadenopathy CARDIAC: S1 S2 regular, 2/6 systolic murmur at the apex. RESPIRATORY:  Clear to auscultation without rales, wheezing or rhonchi  ABDOMEN: Soft, non-tender, non-distended MUSCULOSKELETAL:  No edema; No deformity  SKIN: Warm and dry NEUROLOGIC:  Alert  and oriented x 3 PSYCHIATRIC:  Normal affect    Signed, Joshua JONELLE Crape, MD  10/08/2023 10:01 AM    [facility]

## 2023-10-08 NOTE — Patient Instructions (Addendum)
 Medication Instructions:  Your physician has recommended you make the following change in your medication:   Start Chantix  as directed.  Use nitroglycerin  1 tablet placed under the tongue at the first sign of chest pain or an angina attack. 1 tablet may be used every 5 minutes as needed, for up to 15 minutes. Do not take more than 3 tablets in 15 minutes. If pain persist call 911 or go to the nearest ED.   *If you need a refill on your cardiac medications before your next appointment, please call your pharmacy*   Lab Work: Your physician recommends that you return for lab work in: 6 weeks for CMP and lipids You need to have labs done when you are fasting.  You can come Monday through Friday 8:30 am to 12:00 pm and 1:15 to 4:30. You do not need to make an appointment as the order has already been placed.   If you have labs (blood work) drawn today and your tests are completely normal, you will receive your results only by: MyChart Message (if you have MyChart) OR A paper copy in the mail If you have any lab test that is abnormal or we need to change your treatment, we will call you to review the results.   Testing/Procedures:   Your cardiac CT will be scheduled at one of the below locations:   [facility] [address]  [place], [place] [address]  If scheduled at the [facility] at [address], please enter the parking lot using the [address] entrance and use the FREE valet service at the patient drop-off area. Enter the building and check-in with registration on the main floor.  Please follow these instructions carefully (unless otherwise directed):  An IV will be required for this test and Nitroglycerin  will be given.  Hold all erectile dysfunction medications at least 3 days (72 hrs) prior to test. (Ie viagra, cialis, sildenafil, tadalafil, etc)   On the Night Before the Test: Be sure to Drink plenty of water. Do not consume  any caffeinated/decaffeinated beverages or chocolate 12 hours prior to your test. Do not take any antihistamines 12 hours prior to your test.  On the Day of the Test: Drink plenty of water until 1 hour prior to the test. Do not eat any food 1 hour prior to test. You may take your regular medications prior to the test.  Take metoprolol  (Lopressor ) 25 mg two hours prior to test.  After the Test: Drink plenty of water. After receiving IV contrast, you may experience a mild flushed feeling. This is normal. On occasion, you may experience a mild rash up to 24 hours after the test. This is not dangerous. If this occurs, you can take Benadryl  25 mg, Zyrtec, Claritin, or Allegra and increase your fluid intake. (Patients taking Tikosyn should avoid Benadryl , and may take Zyrtec, Claritin, or Allegra) If you experience trouble breathing, this can be serious. If it is severe call 911 IMMEDIATELY. If it is mild, please call our office.  We will call to schedule your test 2-4 weeks out understanding that some insurance companies will need an authorization prior to the service being performed.   For more information and frequently asked questions, please visit our website : [email or web address]  For non-scheduling related questions, please contact the cardiac imaging nurse navigator should you have any questions/concerns: Cardiac Imaging Nurse Navigators Direct Office Dial: [phone]   For scheduling needs, including cancellations and rescheduling, please  call [place], [phone].  Your physician has requested that you have an echocardiogram. Echocardiography is a painless test that uses sound waves to create images of your heart. It provides your doctor with information about the size and shape of your heart and how well your heart's chambers and valves are working. This procedure takes approximately one hour. There are no restrictions for this procedure. Please do NOT wear cologne,  perfume, aftershave, or lotions (deodorant is allowed). Please arrive 15 minutes prior to your appointment time.  Please note: We ask at that you not bring children with you during ultrasound (echo/ vascular) testing. Due to room size and safety concerns, children are not allowed in the ultrasound rooms during exams. Our front office staff cannot provide observation of children in our lobby area while testing is being conducted. An adult accompanying a patient to their appointment will only be allowed in the ultrasound room at the discretion of the ultrasound technician under special circumstances. We apologize for any inconvenience.  Follow-Up: At [facility], you and your health needs are our priority.  As part of our continuing mission to provide you with exceptional heart care, we have created designated Provider Care Teams.  These Care Teams include your primary Cardiologist (physician) and Advanced Practice Providers (APPs -  Physician Assistants and Nurse Practitioners) who all work together to provide you with the care you need, when you need it.  We recommend signing up for the patient portal called MyChart.  Sign up information is provided on this After Visit Summary.  MyChart is used to connect with patients for Virtual Visits (Telemedicine).  Patients are able to view lab/test results, encounter notes, upcoming appointments, etc.  Non-urgent messages can be sent to your provider as well.   To learn more about what you can do with MyChart, go to [email or web address].    Your next appointment:   9 month(s)  The format for your next appointment:   In Person  Provider:   Jennifer Crape, MD   Other Instructions Cardiac CT Angiogram A cardiac CT angiogram is a procedure to look at the heart and the area around the heart. It may be done to help find the cause of chest pains or other symptoms of heart disease. During this procedure, a substance called contrast dye is injected into  a vein in the arm. The contrast highlights the blood vessels in the area to be checked. A large X-ray machine (CT scanner), then takes detailed pictures of the heart and the surrounding area. The procedure is also sometimes called a coronary CT angiogram, coronary artery scanning, or CTA. A cardiac CT angiogram allows the health care provider to see how well blood is flowing to and from the heart. The provider will be able to see if there are any problems, such as: Blockage or narrowing of the arteries in the heart. Fluid around the heart. Signs of weakness or disease in the muscles, valves, and tissues of the heart. Tell a health care provider about: Any allergies you have. This is especially important if you have had a previous allergic reaction to medicines, contrast dye, or iodine. All medicines you are taking, including vitamins, herbs, eye drops, creams, and over-the-counter medicines. Any bleeding problems you have. Any surgeries you have had. Any medical conditions you have, including kidney problems or kidney failure. Whether you are pregnant or may be pregnant. Any anxiety disorders, chronic pain, or other conditions you have. These may increase your stress or prevent you from lying  still. Any history of abnormal heart rhythms or heart procedures. What are the risks? Your provider will talk with you about risks. These may include: Bleeding. Infection. Allergic reactions to medicines or dyes. Damage to other structures or organs. Kidney damage from the contrast dye. Increased risk of cancer from radiation exposure. This risk is low. Talk with your provider about: The risks and benefits of testing. How you can receive the lowest dose of radiation. What happens before the procedure? Wear comfortable clothing and remove any jewelry, glasses, dentures, and hearing aids. Follow instructions from your provider about eating and drinking. These may include: 12 hours before the  procedure Avoid caffeine. This includes tea, coffee, soda, energy drinks, and diet pills. Drink plenty of water or other fluids that do not have caffeine in them. Being well hydrated can prevent complications. 4-6 hours before the procedure Stop eating and drinking. This will reduce the risk of nausea from the contrast dye. Ask your provider about changing or stopping your regular medicines. These include: Diabetes medicines. Medicines to treat problems with erections (erectile dysfunction). If you have kidney problems, you may need to receive IV hydration before and after the test. What happens during the procedure?  Hair on your chest may need to be removed so that small sticky patches called electrodes can be placed on your chest. These will transmit information that helps to monitor your heart during the procedure. An IV will be inserted into one of your veins. You might be given a medicine to control your heart rate during the procedure. This will help to ensure that good images are obtained. You will be asked to lie on an exam table. This table will slide in and out of the CT machine during the procedure. Contrast dye will be injected into the IV. You might feel warm, or you may get a metallic taste in your mouth. You may be given medicines to relax or dilate the arteries in your heart. If you are allergic to contrast dyes or iodine you may be given medicine before the test to reduce the risk of an allergic reaction. The table that you are lying on will move into the CT machine tunnel for the scan. The person running the machine will give you instructions while the scans are being done. You may be asked to: Keep your arms above your head. Hold your breath for short periods. Stay very still, even if the table is moving. The procedure may vary among providers and hospitals. What can I expect after the procedure? After your procedure, it is common to have: A metallic taste in your mouth  from the contrast dye. A feeling of warmth. A headache from the heart medicine. Follow these instructions at home: Take over-the-counter and prescription medicines only as told by your provider. If you are told, drink enough fluid to keep your pee pale yellow. This will help to flush the contrast dye out of your body. Most people can return to their normal activities right after the procedure. Ask your provider what activities are safe for you. It is up to you to get the results of your procedure. Ask your provider, or the department that is doing the procedure, when your results will be ready. Contact a health care provider if: You have any symptoms of allergy to the contrast dye. These include: Shortness of breath. Rash or hives. A racing heartbeat. You notice a change in your peeing (urination). This information is not intended to replace advice given to  you by your health care provider. Make sure you discuss any questions you have with your health care provider. Document Revised: 09/20/2021 Document Reviewed: 09/20/2021 Elsevier Patient Education  2024 Elsevier Inc.  Echocardiogram An echocardiogram is a test that uses sound waves to make images of your heart. This way of making images is often called ultrasound. The images from this test can help find out many things about your heart, including: The size and shape of your heart. The strength of your heart muscle and how well it's working. The size, thickness, and movement of your heart's walls. How your heart valves are working. Problems such as: A tumor or a growth from an infection around the heart valves. Areas of heart muscle that aren't working well because of poor blood flow or injury from a heart attack. An aneurysm. This is a weak or damaged part of an artery wall. An artery is a blood vessel. Tell a health care provider about: Any allergies you have. All medicines you're taking, including vitamins, herbs, eye drops,  creams, and over-the-counter medicines. Any bleeding problems you have. Any surgeries you've had. Any medical problems you have. Whether you're pregnant or may be pregnant. What are the risks? Your health care provider will talk with you about risks. These may include an allergic reaction to IV dye that may be used during the test. What happens before the test? You don't need to do anything to get ready for this test. You may eat and drink normally. What happens during the test?  You'll take off your clothes from the waist up and put on a hospital gown. Sticky patches called electrodes may be placed on your chest. These will be connected to a machine that monitors your heart rate and rhythm. You'll lie down on a table for the exam. A wand covered in gel will be moved over your chest. Sound waves from the wand will go to your heart and bounce back--or echo back. The sound waves will go to a computer that uses them to make images of your heart. The images can be viewed on a monitor. The images will also be recorded on the computer so your provider can look at them later. You may be asked to change positions or hold your breath for a short time. This makes it easier to get different views or better views of your heart. In some cases, you may be given a dye through an IV. The IV is put into one of your veins. This dye can make the areas of your heart easier to see. The procedure may vary among providers and hospitals. What can I expect after the test? You may return to your normal diet, activities, and medicines unless your provider tells you not to. If an IV was placed for the test, it will be removed. It's up to you to get the results of your test. Ask your provider, or the department that's doing the test, when your results will be ready. This information is not intended to replace advice given to you by your health care provider. Make sure you discuss any questions you have with your health  care provider. Document Revised: 04/18/2022 Document Reviewed: 04/18/2022 Elsevier Patient Education  2024 [employer].

## 2023-10-13 ENCOUNTER — Telehealth: Payer: Self-pay | Admitting: Cardiology

## 2023-10-13 NOTE — Telephone Encounter (Signed)
 Wife is calling to get our office to send a order for the CT and Echo to be done at [facility]. Please advise

## 2023-10-14 NOTE — Telephone Encounter (Signed)
 Spoke with [name] per DPR and advised that currently the insurance has only approved the echo at this time. Advised that once the CT has been approved, the order will be sent to [facility]. Joshua Pollard verbalized understanding and had no additional questions.

## 2023-12-12 ENCOUNTER — Other Ambulatory Visit: Payer: Self-pay | Admitting: Cardiology

## 2023-12-12 DIAGNOSIS — F17210 Nicotine dependence, cigarettes, uncomplicated: Secondary | ICD-10-CM
